# Patient Record
Sex: Male | Born: 1970 | Race: Black or African American | Hispanic: No | Marital: Single | Smoking: Never smoker
Health system: Southern US, Community
[De-identification: ages and names within clinical notes are randomized; demographics above are authoritative.]

## PROBLEM LIST (undated history)

## (undated) DIAGNOSIS — I10 Essential (primary) hypertension: Secondary | ICD-10-CM

---

## 2017-12-18 ENCOUNTER — Emergency Department (HOSPITAL_COMMUNITY): Payer: Self-pay

## 2017-12-18 ENCOUNTER — Encounter (HOSPITAL_COMMUNITY): Payer: Self-pay | Admitting: Emergency Medicine

## 2017-12-18 ENCOUNTER — Other Ambulatory Visit: Payer: Self-pay

## 2017-12-18 ENCOUNTER — Emergency Department (HOSPITAL_COMMUNITY)
Admission: EM | Admit: 2017-12-18 | Discharge: 2017-12-18 | Disposition: A | Discharge status: Home / Self Care | Payer: Self-pay | Attending: Emergency Medicine | Admitting: Emergency Medicine

## 2017-12-18 DIAGNOSIS — I10 Essential (primary) hypertension: Secondary | ICD-10-CM | POA: Insufficient documentation

## 2017-12-18 DIAGNOSIS — Y9301 Activity, walking, marching and hiking: Secondary | ICD-10-CM | POA: Insufficient documentation

## 2017-12-18 DIAGNOSIS — Y92511 Restaurant or cafe as the place of occurrence of the external cause: Secondary | ICD-10-CM | POA: Insufficient documentation

## 2017-12-18 DIAGNOSIS — W010XXA Fall on same level from slipping, tripping and stumbling without subsequent striking against object, initial encounter: Secondary | ICD-10-CM | POA: Insufficient documentation

## 2017-12-18 DIAGNOSIS — M79662 Pain in left lower leg: Secondary | ICD-10-CM | POA: Insufficient documentation

## 2017-12-18 DIAGNOSIS — W19XXXA Unspecified fall, initial encounter: Secondary | ICD-10-CM

## 2017-12-18 DIAGNOSIS — Y999 Unspecified external cause status: Secondary | ICD-10-CM | POA: Insufficient documentation

## 2017-12-18 DIAGNOSIS — M62838 Other muscle spasm: Secondary | ICD-10-CM | POA: Insufficient documentation

## 2017-12-18 DIAGNOSIS — S40012A Contusion of left shoulder, initial encounter: Secondary | ICD-10-CM | POA: Insufficient documentation

## 2017-12-18 HISTORY — DX: Essential (primary) hypertension: I10

## 2017-12-18 MED ORDER — IBUPROFEN 200 MG PO TABS
400.0000 mg | ORAL_TABLET | Freq: Once | ORAL | Status: AC
Start: 2017-12-18 — End: 2017-12-18
  Administered 2017-12-18: 400 mg via ORAL
  Filled 2017-12-18: qty 2

## 2017-12-18 MED ORDER — CYCLOBENZAPRINE HCL 10 MG PO TABS: 10.0000 mg | ORAL_TABLET | Freq: Two times a day (BID) | ORAL | 0 refills | Status: AC | PRN

## 2017-12-18 MED ORDER — ACETAMINOPHEN 500 MG PO TABS
1000.0000 mg | ORAL_TABLET | Freq: Once | ORAL | Status: AC
  Administered 2017-12-18: 1000 mg via ORAL
  Filled 2017-12-18: qty 2

## 2017-12-18 NOTE — ED Triage Notes (Signed)
Pt verbalizes slipped and fell on wet floor; complaint of left neck, shoulder, and hand pain.
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
## 2017-12-18 NOTE — ED Notes (Signed)
Patient transported to X-ray 

## 2019-04-14 ENCOUNTER — Encounter (HOSPITAL_BASED_OUTPATIENT_CLINIC_OR_DEPARTMENT_OTHER): Payer: Self-pay | Admitting: *Deleted

## 2019-04-14 ENCOUNTER — Other Ambulatory Visit: Payer: Self-pay

## 2019-04-14 ENCOUNTER — Emergency Department (HOSPITAL_BASED_OUTPATIENT_CLINIC_OR_DEPARTMENT_OTHER)
Admission: EM | Admit: 2019-04-14 | Discharge: 2019-04-14 | Disposition: A | Discharge status: Home / Self Care | Payer: BLUE CROSS/BLUE SHIELD | Attending: Emergency Medicine | Admitting: Emergency Medicine

## 2019-04-14 DIAGNOSIS — Z79899 Other long term (current) drug therapy: Secondary | ICD-10-CM | POA: Insufficient documentation

## 2019-04-14 DIAGNOSIS — I10 Essential (primary) hypertension: Secondary | ICD-10-CM | POA: Insufficient documentation

## 2019-04-14 DIAGNOSIS — L03211 Cellulitis of face: Secondary | ICD-10-CM | POA: Diagnosis not present

## 2019-04-14 DIAGNOSIS — R220 Localized swelling, mass and lump, head: Secondary | ICD-10-CM | POA: Diagnosis present

## 2019-04-14 NOTE — ED Triage Notes (Addendum)
Abscess to his forehead  X 4 days. He has swelling and redness under his eyes that started after taking 2 antibiotics Rx by [facility].

## 2019-04-14 NOTE — Discharge Instructions (Signed)
Please keep taking your antibiotic.  You may use warm compresses on the area.  You may take ibuprofen or Tylenol as needed for pain.  If you develop fevers, worsening symptoms, or have additional concerns please return to the emergency room for repeat evaluation.  If you do not have significant improvement in 2 days I would recommend that you return to the emergency room for repeat evaluation.  As we discussed today based on the ultrasound it does not appear that you need your abscess cut into today.  You may need it to be drained in the future.

## 2019-04-14 NOTE — ED Provider Notes (Signed)
[facility] EMERGENCY DEPARTMENT Provider Note   CSN: [id number] Arrival date & time: 04/14/19  1809    History   Chief Complaint Chief Complaint  Patient presents with  . Abscess    HPI Scott Randall is a 48 y.o. male with a past medical history of hypertension presents today for evaluation of swelling on his forehead and redness under his eyes.  The swelling on his forehead started approximately 4 days ago.  He went to urgent care and he was placed on Augmentin and doxycycline.  2 days ago he woke up with swelling under his bilateral eyes, he contacted urgent care and they changed his antibiotic to clindamycin.  He reports that he continues to have intermittent redness/swelling under his bilateral eyes.  He denies any vision changes.  No fevers, nausea, vomiting, or diarrhea.  He denies any pain when he looks around.  No headache, neck stiffness.     HPI  Past Medical History:  Diagnosis Date  . Hypertension     There are no active problems to display for this patient.   History reviewed. No pertinent surgical history.      Home Medications    Prior to Admission medications   Medication Sig Start Date End Date Taking? Authorizing Provider  LISINOPRIL PO Take by mouth.   Yes [provider]  cyclobenzaprine (FLEXERIL) 10 MG tablet Take 1 tablet (10 mg total) by mouth 2 (two) times daily as needed for muscle spasms. 12/18/17   Gwyneth SproutPlunkett, Whitney, MD    Family History No family history on file.  Social History Social History   Tobacco Use  . Smoking status: Never Smoker  . Smokeless tobacco: Never Used  Substance Use Topics  . Alcohol use: No    Frequency: Never  . Drug use: No     Allergies   Patient has no known allergies.   Review of Systems Review of Systems  Constitutional: Negative for chills, diaphoresis and fever.  HENT: Positive for facial swelling.   Eyes: Negative for pain, redness and visual disturbance.  Cardiovascular:  Negative for chest pain.  Gastrointestinal: Negative for anal bleeding.  Musculoskeletal: Negative for back pain.  Neurological: Negative for dizziness and facial asymmetry.  All other systems reviewed and are negative.    Physical Exam Updated Vital Signs BP (!) 161/111   Pulse (!) 106   Temp 98.2 F (36.8 C) (Oral)   Resp 20   Ht 5\' 4"  (1.626 m)   Wt 102.1 kg   SpO2 98%   BMI 38.62 kg/m   Physical Exam Vitals signs and nursing note reviewed.  HENT:     Head:     Comments: Please see clinical image.  There is approximately 1.5 x 1.5 area of redness, induration, and swelling on patient's right sided forehead.  There is mild redness under bilateral eyes along the tops of the cheekbones without market periorbital edema.  He has full, pain-free, extraocular range of motion.    Nose: Nose normal. No congestion.  Eyes:     Conjunctiva/sclera: Conjunctivae normal.  Neck:     Musculoskeletal: Normal range of motion and neck supple. No neck rigidity.  Cardiovascular:     Rate and Rhythm: Normal rate.     Pulses: Normal pulses.  Pulmonary:     Effort: Pulmonary effort is normal.  Skin:    General: Skin is warm.  Neurological:     General: No focal deficit present.     Mental Status: He is  alert.  Psychiatric:        Mood and Affect: Mood normal.      ED Treatments / Results  Labs (all labs ordered are listed, but only abnormal results are displayed) Labs Reviewed - No data to display  EKG None  Radiology No results found.  Procedures Ultrasound ED Soft Tissue Date/Time: 04/14/2019 6:59 PM Performed by: Cristina Gong, PA-C Authorized by: Cristina Gong, PA-C   Procedure details:    Indications: localization of abscess and evaluate for cellulitis     Transverse view:  Visualized   Longitudinal view:  Visualized   Images: archived   Location:    Location: face     Side:  Right Findings:     abscess present (Small, under 1cm)    cellulitis  present   (including critical care time)  Medications Ordered in ED Medications - No data to display   Initial Impression / Assessment and Plan / ED Course  I have reviewed the triage vital signs and the nursing notes.  Pertinent labs & imaging results that were available during my care of the patient were reviewed by me and considered in my medical decision making (see chart for details).  Clinical Course as of Apr 14 1855  Thu Apr 14, 2019  1850 HR 98.  Afebrile   [EH]    Clinical Course User Index [EH] Cristina Gong, PA-C      Scott Shad presents today for evaluation of swelling and redness on his forehead.  This is been present for 4 days.  He was placed initially on Augmentin and doxycycline by urgent care, after he had swelling under his eyes they change his antibiotic to clindamycin, so far he has had 2 doses of clindamycin.  Bedside ultrasound was obtained, does not show a fluid collection that would be easily drainable at this time.  He is hypertensive, he is aware of this.  He is afebrile, on recheck he is not tachycardic or tachypneic.  Do not suspect sepsis.  I discussed treatment options with him, recommended that he continue taking his antibiotics and return for wound recheck in 2 days if he has not had significant improvement.  Physical exam is not consistent with orbital pre-or post septal cellulitis.   Return precautions were discussed with patient who states their understanding.  At the time of discharge patient denied any unaddressed complaints or concerns.  Patient is agreeable for discharge home.   Final Clinical Impressions(s) / ED Diagnoses   Final diagnoses:  Cellulitis of face    ED Discharge Orders    None       Norman Clay 04/14/19 1900    Geoffery Lyons, MD 04/14/19 2139

## 2019-04-19 ENCOUNTER — Other Ambulatory Visit: Payer: Self-pay

## 2019-04-19 ENCOUNTER — Encounter (HOSPITAL_BASED_OUTPATIENT_CLINIC_OR_DEPARTMENT_OTHER): Payer: Self-pay

## 2019-04-19 ENCOUNTER — Emergency Department (HOSPITAL_BASED_OUTPATIENT_CLINIC_OR_DEPARTMENT_OTHER)
Admission: EM | Admit: 2019-04-19 | Discharge: 2019-04-19 | Disposition: A | Discharge status: Home / Self Care | Payer: BLUE CROSS/BLUE SHIELD | Attending: Emergency Medicine | Admitting: Emergency Medicine

## 2019-04-19 DIAGNOSIS — Z79899 Other long term (current) drug therapy: Secondary | ICD-10-CM | POA: Diagnosis not present

## 2019-04-19 DIAGNOSIS — L0201 Cutaneous abscess of face: Secondary | ICD-10-CM | POA: Diagnosis not present

## 2019-04-19 DIAGNOSIS — I10 Essential (primary) hypertension: Secondary | ICD-10-CM | POA: Diagnosis not present

## 2019-04-19 DIAGNOSIS — L0291 Cutaneous abscess, unspecified: Secondary | ICD-10-CM

## 2019-04-19 MED ORDER — LIDOCAINE-EPINEPHRINE 2 %-1:100000 IJ SOLN
20.0000 mL | Freq: Once | INTRAMUSCULAR | Status: DC
  Filled 2019-04-19: qty 20

## 2019-04-19 MED ORDER — LIDOCAINE-EPINEPHRINE (PF) 2 %-1:200000 IJ SOLN
INTRAMUSCULAR | Status: AC
  Administered 2019-04-19: 09:00:00
  Filled 2019-04-19: qty 10

## 2019-04-19 MED ORDER — OXYCODONE HCL 5 MG PO TABS
5.0000 mg | ORAL_TABLET | Freq: Once | ORAL | Status: DC
  Filled 2019-04-19: qty 1

## 2019-04-19 MED ORDER — ACETAMINOPHEN 500 MG PO TABS
1000.0000 mg | ORAL_TABLET | Freq: Once | ORAL | Status: AC
Start: 2019-04-19 — End: 2019-04-19
  Administered 2019-04-19: 1000 mg via ORAL
  Filled 2019-04-19: qty 2

## 2019-04-19 NOTE — ED Triage Notes (Signed)
Pt here for recheck of abcess forehead, states since last being seen has a new area left shoulder.  Denies fever. Area red, raised.

## 2019-04-19 NOTE — ED Provider Notes (Signed)
[facility] EMERGENCY DEPARTMENT Provider Note   CSN: [id number] Arrival date & time: 04/19/19  7829    History   Chief Complaint Chief Complaint  Patient presents with  . Abscess    HPI Scott Randall is a 48 y.o. male.     48 yo M here with a chief complaint of forehead abscess.  This been going on for the past week or so.  Is gotten much larger over the past couple days.  No fevers or chills.  He tried to lance it himself yesterday and it aches significantly more swollen this morning.  He has been to the ED for this previously and had an ultrasound that showed a very small abscess that was not drained.  He is on clindamycin currently.  He also noted that he has some swelling in a mobile spot to his left shoulder just above the clavicle.  He denies night sweats denies unintentional weight loss denies generalized fatigue.  The history is provided by the patient.  Abscess  Location:  Face Facial abscess location:  Face and forehead Size:  Golf ball Abscess quality: draining, fluctuance, induration, itching and painful   Red streaking: no   Duration:  1 week Progression:  Worsening Pain details:    Quality:  Aching and burning   Severity:  Mild   Duration:  1 week   Timing:  Constant   Progression:  Worsening Chronicity:  New Context: not diabetes, not immunosuppression, not injected drug use, not insect bite/sting and not skin injury   Relieved by:  Nothing Worsened by:  Nothing Ineffective treatments:  Oral antibiotics Associated symptoms: no anorexia, no fatigue, no fever, no headaches, no nausea and no vomiting     Past Medical History:  Diagnosis Date  . Hypertension     There are no active problems to display for this patient.   History reviewed. No pertinent surgical history.      Home Medications    Prior to Admission medications   Medication Sig Start Date End Date Taking? Authorizing Provider  cyclobenzaprine (FLEXERIL) 10 MG tablet Take  1 tablet (10 mg total) by mouth 2 (two) times daily as needed for muscle spasms. 12/18/17   Gwyneth Sprout, MD  LISINOPRIL PO Take by mouth.    [provider]    Family History History reviewed. No pertinent family history.  Social History Social History   Tobacco Use  . Smoking status: Never Smoker  . Smokeless tobacco: Never Used  Substance Use Topics  . Alcohol use: No    Frequency: Never  . Drug use: No     Allergies   Patient has no known allergies.   Review of Systems Review of Systems  Constitutional: Negative for chills, fatigue and fever.  HENT: Negative for congestion and facial swelling.   Eyes: Negative for discharge and visual disturbance.  Respiratory: Negative for shortness of breath.   Cardiovascular: Negative for chest pain and palpitations.  Gastrointestinal: Negative for abdominal pain, anorexia, diarrhea, nausea and vomiting.  Musculoskeletal: Negative for arthralgias and myalgias.  Skin: Positive for wound. Negative for color change and rash.  Neurological: Negative for tremors, syncope and headaches.  Psychiatric/Behavioral: Negative for confusion and dysphoric mood.     Physical Exam Updated Vital Signs BP (!) 153/88 (BP Location: Right Arm)   Pulse (!) 102   Temp 98.2 F (36.8 C) (Oral)   Resp 16   Ht 5\' 4"  (1.626 m)   Wt 101.6 kg   SpO2  99%   BMI 38.45 kg/m   Physical Exam Vitals signs and nursing note reviewed.  Constitutional:      Appearance: He is well-developed.  HENT:     Head: Normocephalic and atraumatic.   Eyes:     Pupils: Pupils are equal, round, and reactive to light.  Neck:     Musculoskeletal: Normal range of motion and neck supple.     Vascular: No JVD.  Cardiovascular:     Rate and Rhythm: Normal rate and regular rhythm.     Heart sounds: No murmur. No friction rub. No gallop.   Pulmonary:     Effort: No respiratory distress.     Breath sounds: No wheezing.  Abdominal:     General: There is no  distension.     Tenderness: There is no guarding or rebound.  Musculoskeletal: Normal range of motion.     Comments: Mobile nontender lymph node to the left supraclavicular region  Skin:    Coloration: Skin is not pale.     Findings: No rash.  Neurological:     Mental Status: He is alert and oriented to person, place, and time.  Psychiatric:        Behavior: Behavior normal.      ED Treatments / Results  Labs (all labs ordered are listed, but only abnormal results are displayed) Labs Reviewed - No data to display  EKG None  Radiology No results found.  Procedures .Nerve Block Date/Time: 04/19/2019 9:12 AM Performed by: Melene Plan, DO Authorized by: Melene Plan, DO   Consent:    Consent obtained:  Verbal   Consent given by:  Patient   Risks discussed:  Infection, bleeding and allergic reaction   Alternatives discussed:  Alternative treatment Indications:    Indications:  Procedural anesthesia Location:    Body area:  Head   Head nerve:  Supratrochlear   Laterality:  Right Pre-procedure details:    Skin preparation:  2% chlorhexidine   Preparation: Patient was prepped and draped in usual sterile fashion   Skin anesthesia (see MAR for exact dosages):    Skin anesthesia method:  Local infiltration   Local anesthetic:  Lidocaine 2% WITH epi Procedure details (see MAR for exact dosages):    Block needle gauge:  21 G   Steroid injected:  None   Additive injected:  None   Injection procedure:  Anatomic landmarks identified and anatomic landmarks palpated   Paresthesia:  None Post-procedure details:    Dressing:  None   Outcome:  Anesthesia achieved   Patient tolerance of procedure:  Tolerated well, no immediate complications .Marland KitchenIncision and Drainage Date/Time: 04/19/2019 9:13 AM Performed by: Melene Plan, DO Authorized by: Melene Plan, DO   Consent:    Consent obtained:  Verbal   Consent given by:  Patient   Risks discussed:  Bleeding, incomplete drainage and  infection   Alternatives discussed:  No treatment, delayed treatment and alternative treatment Location:    Type:  Abscess   Size:  Golf ball   Location:  Head   Head location:  Face Pre-procedure details:    Skin preparation:  Chloraprep Anesthesia (see MAR for exact dosages):    Anesthesia method:  Nerve block and local infiltration   Local anesthetic:  Lidocaine 2% WITH epi   Block needle gauge:  25 G   Block anesthetic:  Lidocaine 2% WITH epi   Block injection procedure:  Anatomic landmarks identified and anatomic landmarks palpated   Block outcome:  Anesthesia achieved Procedure  type:    Complexity:  Complex Procedure details:    Needle aspiration: no     Incision types:  Single straight   Incision depth:  Subcutaneous   Scalpel blade:  11   Wound management:  Probed and deloculated   Drainage:  Purulent   Drainage amount:  Copious   Wound treatment:  Wound left open   Packing materials:  None Post-procedure details:    Patient tolerance of procedure:  Tolerated well, no immediate complications   (including critical care time)  Medications Ordered in ED Medications  lidocaine-EPINEPHrine (XYLOCAINE W/EPI) 2 %-1:100000 (with pres) injection 20 mL (20 mLs Infiltration Not Given 04/19/19 0852)  oxyCODONE (Oxy IR/ROXICODONE) immediate release tablet 5 mg (5 mg Oral Not Given 04/19/19 0847)  acetaminophen (TYLENOL) tablet 1,000 mg (1,000 mg Oral Given 04/19/19 0846)  lidocaine-EPINEPHrine (XYLOCAINE W/EPI) 2 %-1:200000 (PF) injection (  Given by Other 04/19/19 [id number])     Initial Impression / Assessment and Plan / ED Course  I have reviewed the triage vital signs and the nursing notes.  Pertinent labs & imaging results that were available during my care of the patient were reviewed by me and considered in my medical decision making (see chart for details).        48 yo M with a chief complaint of a abscess.  Patient has been on antibiotics but has had worsening.  I will  lanced at bedside.  The patient did have a lot of cystlike material that came out.  There was some purulence involved as well.  We will have him continue his antibiotics.  Follow-up with his family doctor.  Of note the patient also has a swollen supraclavicular lymph node.  He has no B symptoms.  This may be due to the current abscess.  We will have him follow-up with his family doctor for reassessment if this does not resolve post infection.  9:25 AM:  I have discussed the diagnosis/risks/treatment options with the patient and believe the pt to be eligible for discharge home to follow-up with PCP. We also discussed returning to the ED immediately if new or worsening sx occur. We discussed the sx which are most concerning (e.g., sudden worsening pain, fever, inability to tolerate by mouth) that necessitate immediate return. Medications administered to the patient during their visit and any new prescriptions provided to the patient are listed below.  Medications given during this visit Medications  lidocaine-EPINEPHrine (XYLOCAINE W/EPI) 2 %-1:100000 (with pres) injection 20 mL (20 mLs Infiltration Not Given 04/19/19 0852)  oxyCODONE (Oxy IR/ROXICODONE) immediate release tablet 5 mg (5 mg Oral Not Given 04/19/19 0847)  acetaminophen (TYLENOL) tablet 1,000 mg (1,000 mg Oral Given 04/19/19 0846)  lidocaine-EPINEPHrine (XYLOCAINE W/EPI) 2 %-1:200000 (PF) injection (  Given by Other 04/19/19 [id number])     The patient appears reasonably screen and/or stabilized for discharge and I doubt any other medical condition or other [facility] requiring further screening, evaluation, or treatment in the ED at this time prior to discharge.    Final Clinical Impressions(s) / ED Diagnoses   Final diagnoses:  Abscess    ED Discharge Orders    None       Melene PlanFloyd, Bijal Siglin, DO 04/19/19 [id number]

## 2019-04-19 NOTE — Discharge Instructions (Signed)
Warm compresses at least 4x a day.  Continue your antibiotics until gone.  Return to the ED for fever, rapid spreading redness or if you feel unwell.    Have your family doc take a look at the swelling to your left shoulder if it persists after this infection has ended.

## 2019-06-21 ENCOUNTER — Encounter (HOSPITAL_BASED_OUTPATIENT_CLINIC_OR_DEPARTMENT_OTHER): Payer: Self-pay | Admitting: *Deleted

## 2019-06-21 ENCOUNTER — Emergency Department (HOSPITAL_BASED_OUTPATIENT_CLINIC_OR_DEPARTMENT_OTHER): Payer: BLUE CROSS/BLUE SHIELD

## 2019-06-21 ENCOUNTER — Emergency Department (HOSPITAL_BASED_OUTPATIENT_CLINIC_OR_DEPARTMENT_OTHER)
Admission: EM | Admit: 2019-06-21 | Discharge: 2019-06-21 | Disposition: A | Discharge status: Home / Self Care | Payer: BLUE CROSS/BLUE SHIELD | Attending: Emergency Medicine | Admitting: Emergency Medicine

## 2019-06-21 ENCOUNTER — Other Ambulatory Visit: Payer: Self-pay

## 2019-06-21 DIAGNOSIS — Y9389 Activity, other specified: Secondary | ICD-10-CM | POA: Diagnosis not present

## 2019-06-21 DIAGNOSIS — Y998 Other external cause status: Secondary | ICD-10-CM | POA: Insufficient documentation

## 2019-06-21 DIAGNOSIS — Z79899 Other long term (current) drug therapy: Secondary | ICD-10-CM | POA: Diagnosis not present

## 2019-06-21 DIAGNOSIS — Y92018 Other place in single-family (private) house as the place of occurrence of the external cause: Secondary | ICD-10-CM | POA: Diagnosis not present

## 2019-06-21 DIAGNOSIS — S93401A Sprain of unspecified ligament of right ankle, initial encounter: Secondary | ICD-10-CM | POA: Diagnosis not present

## 2019-06-21 DIAGNOSIS — I10 Essential (primary) hypertension: Secondary | ICD-10-CM | POA: Insufficient documentation

## 2019-06-21 DIAGNOSIS — X501XXA Overexertion from prolonged static or awkward postures, initial encounter: Secondary | ICD-10-CM | POA: Insufficient documentation

## 2019-06-21 DIAGNOSIS — S99911A Unspecified injury of right ankle, initial encounter: Secondary | ICD-10-CM | POA: Diagnosis present

## 2019-06-21 NOTE — Discharge Instructions (Addendum)
You were seen today for R ankle pain. Your xray did not show signs of fracture. Continue to use ibuprofen every 4-6 hours as needed for pain and swelling. Continue to rest your ankle, ice, and wrap in an ASO brace. You can find this from [place]. Consider calling the Sports Medicine clinic for a follow up evaluation.

## 2019-06-21 NOTE — ED Triage Notes (Addendum)
Rolled rt ankle 3 weeks ago coming down steps w increased pain and swelling,  Ambulatory without diff

## 2019-06-21 NOTE — ED Provider Notes (Signed)
[facility] EMERGENCY DEPARTMENT Provider Note   CSN: [id number] Arrival date & time: 06/21/19  0827    History   Chief Complaint Chief Complaint  Patient presents with  . Ankle Pain    HPI Scott Randall is a 48 y.o. male with PMG significant for HTN, obesity here for R ankle pain.  3 weeks ago he was coming down the stairs at his home and rolled his right ankle outwards with subsequent pain.  He states he was able to put weight on it immediately after.  He has been taking intermittent ibuprofen and has put ice on it and wrapping with an Ace bandage a few times but still with persistent pain and swelling, thinks it is getting worse.  He is able to walk without assistance.  Denies any prior surgeries or instrumentation.  Denies any fevers or redness.  Past Medical History:  Diagnosis Date  . Hypertension     There are no active problems to display for this patient.   History reviewed. No pertinent surgical history.    Home Medications    Prior to Admission medications   Medication Sig Start Date End Date Taking? Authorizing Provider  cyclobenzaprine (FLEXERIL) 10 MG tablet Take 1 tablet (10 mg total) by mouth 2 (two) times daily as needed for muscle spasms. 12/18/17   Blanchie Dessert, MD  LISINOPRIL PO Take by mouth.    [provider]    Family History No family history on file.  Social History Social History   Tobacco Use  . Smoking status: Never Smoker  . Smokeless tobacco: Never Used  Substance Use Topics  . Alcohol use: No    Frequency: Never  . Drug use: No    Allergies   Patient has no known allergies.   Review of Systems Review of Systems  Constitutional: Negative for appetite change and fever.  Respiratory: Negative for shortness of breath.   Cardiovascular: Negative for chest pain.  Gastrointestinal: Negative for abdominal pain, nausea and vomiting.  Musculoskeletal: Positive for joint swelling.    Physical Exam Updated  Vital Signs BP (!) 146/101 (BP Location: Left Arm)   Pulse 81   Temp 98.8 F (37.1 C) (Oral)   Resp 18   Ht 5\' 5"  (1.651 m)   Wt 99.8 kg   SpO2 98%   BMI 36.61 kg/m   Physical Exam Constitutional:      Appearance: Normal appearance. He is not ill-appearing or toxic-appearing.  Cardiovascular:     Rate and Rhythm: Normal rate and regular rhythm.     Heart sounds: Normal heart sounds. No murmur.  Pulmonary:     Effort: Pulmonary effort is normal. No respiratory distress.     Breath sounds: Normal breath sounds.  Abdominal:     General: Bowel sounds are normal.     Tenderness: There is no abdominal tenderness.  Musculoskeletal:     Right lower leg: No edema.     Left lower leg: No edema.     Comments: Slight swelling noted to lateral R ankle without erythema or point tenderness to lateral malleolus or 5th metatarsal head. No joint laxity on exam. 2+ DP pulses.   Skin:    General: Skin is warm and dry.     Comments: Tinea pedis noted to 3-5th digits on R foot.  Neurological:     General: No focal deficit present.     Mental Status: He is oriented to person, place, and time.     ED Treatments /  Results  Labs (all labs ordered are listed, but only abnormal results are displayed) Labs Reviewed - No data to display  EKG None  Radiology Dg Ankle Complete Right  Result Date: 06/21/2019 CLINICAL DATA:  Pain and swelling since an ankle injury 3 weeks ago. EXAM: RIGHT ANKLE - COMPLETE 3+ VIEW COMPARISON:  None. FINDINGS: There is no evidence of fracture, dislocation, or joint effusion. There is no evidence of arthropathy or other focal bone abnormality. Soft tissues are unremarkable. IMPRESSION: Negative. Electronically Signed   By: Francene BoyersJames  Maxwell M.D.   On: 06/21/2019 09:43    Procedures Procedures (including critical care time)  Medications Ordered in ED Medications - No data to display   Initial Impression / Assessment and Plan / ED Course  I have reviewed the triage  vital signs and the nursing notes.  Pertinent labs & imaging results that were available during my care of the patient were reviewed by me and considered in my medical decision making (see chart for details).   48yo M w/ h/o HTN here for R ankle pain after eversion injury. No joint laxity on exam or point tenderness to lateral malleolus or 5th metatarsal head. XR obtained given duration of symptoms, which was negative for fracture. Recommended continued conservative treatment with rest, ice, compression, elevation, and NSAIDs. Has spray at home for tinea pedis. Also recommended follow up with Sports Medicine if no better in a few weeks.    Final Clinical Impressions(s) / ED Diagnoses   Final diagnoses:  Sprain of right ankle, unspecified ligament, initial encounter    ED Discharge Orders    None       Ellwood DenseRumball, Alison, DO 06/21/19 1023    Blane OharaZavitz, Joshua, MD 06/21/19 1439

## 2019-11-10 IMAGING — DX RIGHT ANKLE - COMPLETE 3+ VIEW
3 series · 3 of 3 positions shown · non-contrast
Comparison: None.

CLINICAL DATA: Pain and swelling since an ankle injury 3 weeks ago.

EXAM:
RIGHT ANKLE - COMPLETE 3+ VIEW

[ankle ap]
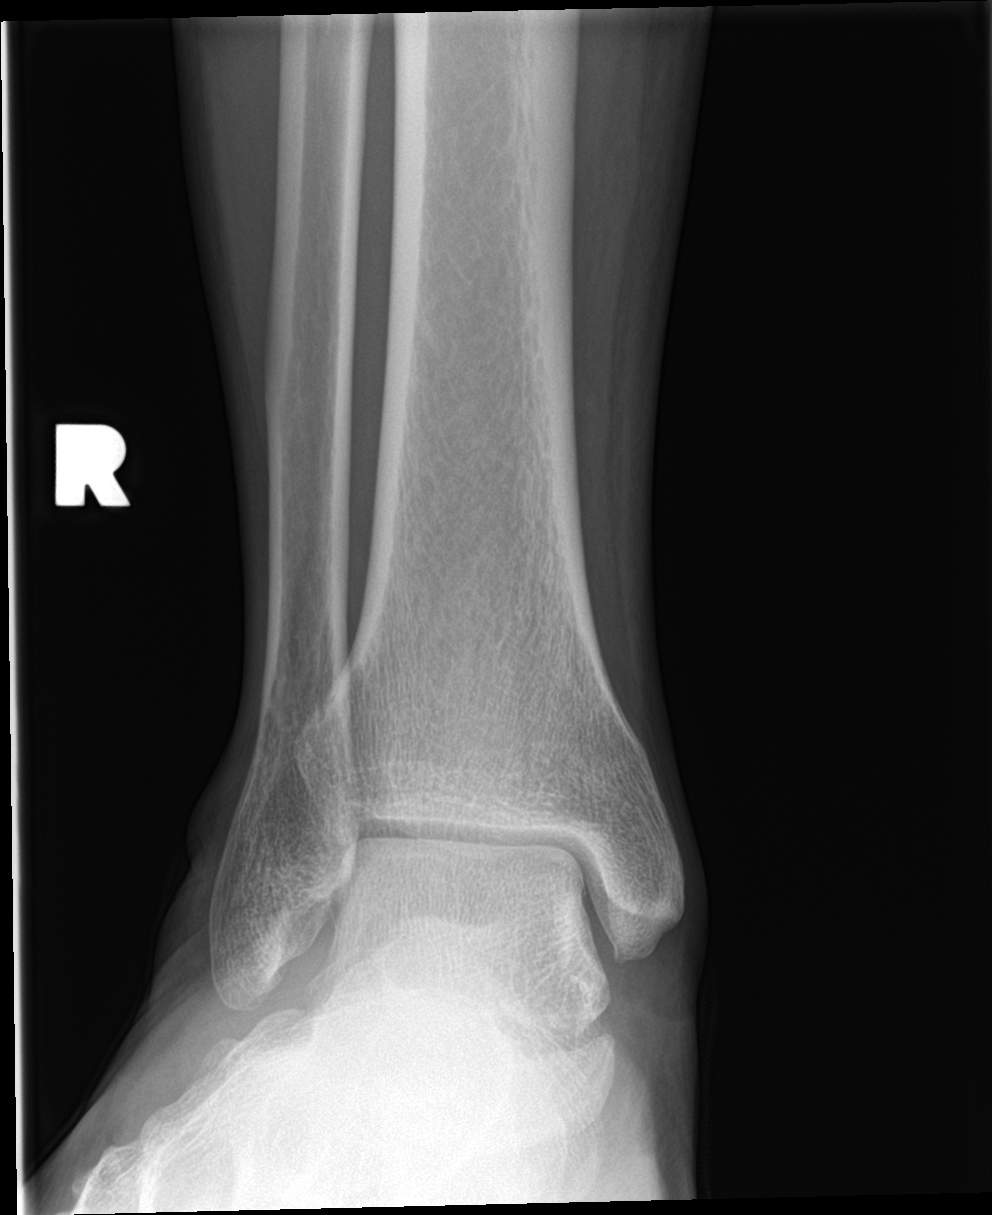

[ankle obl]
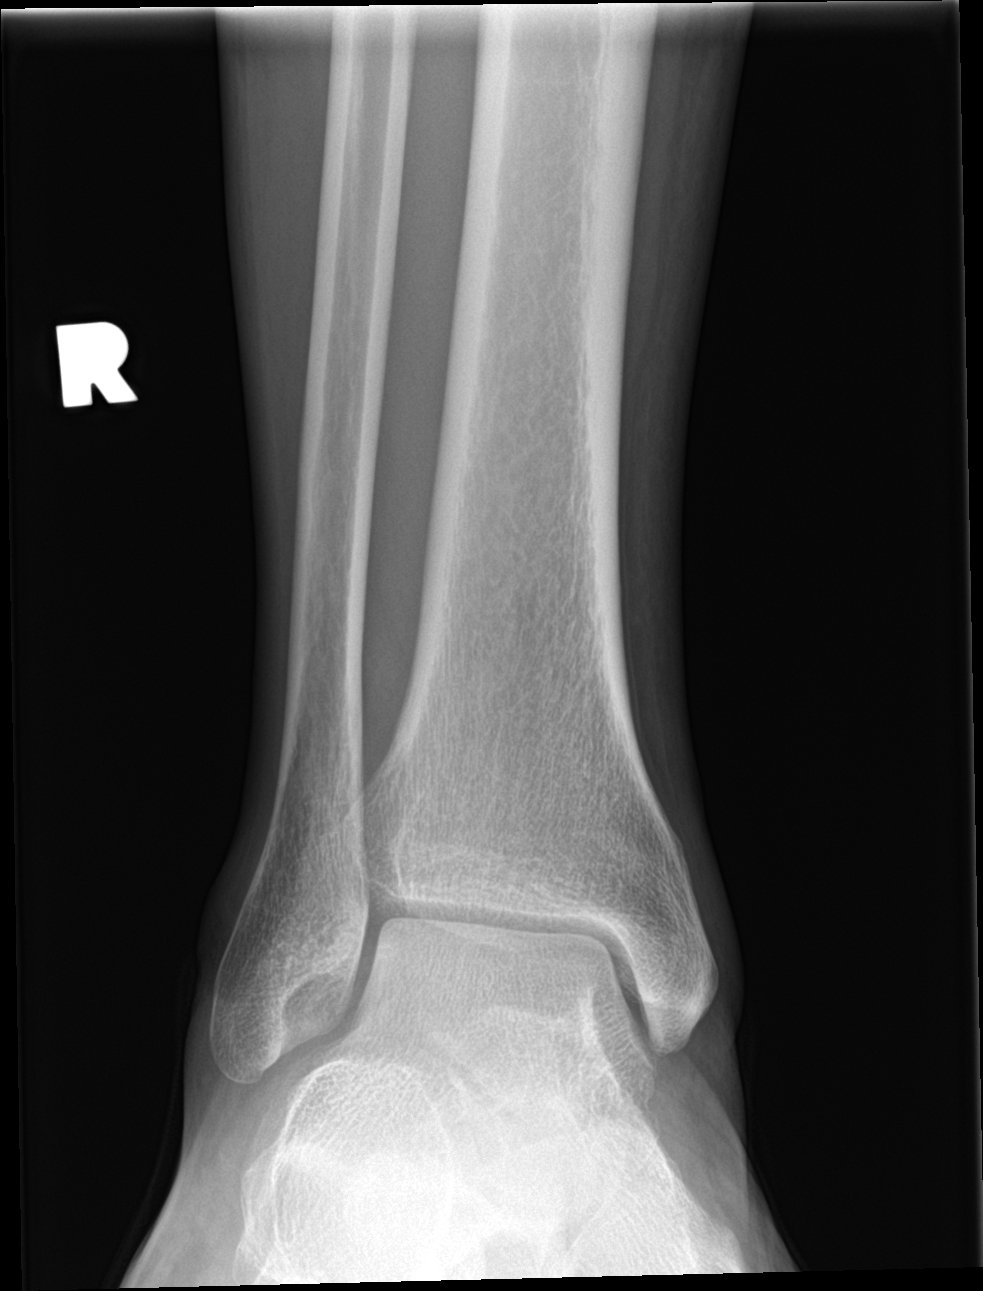

[ankle lat]
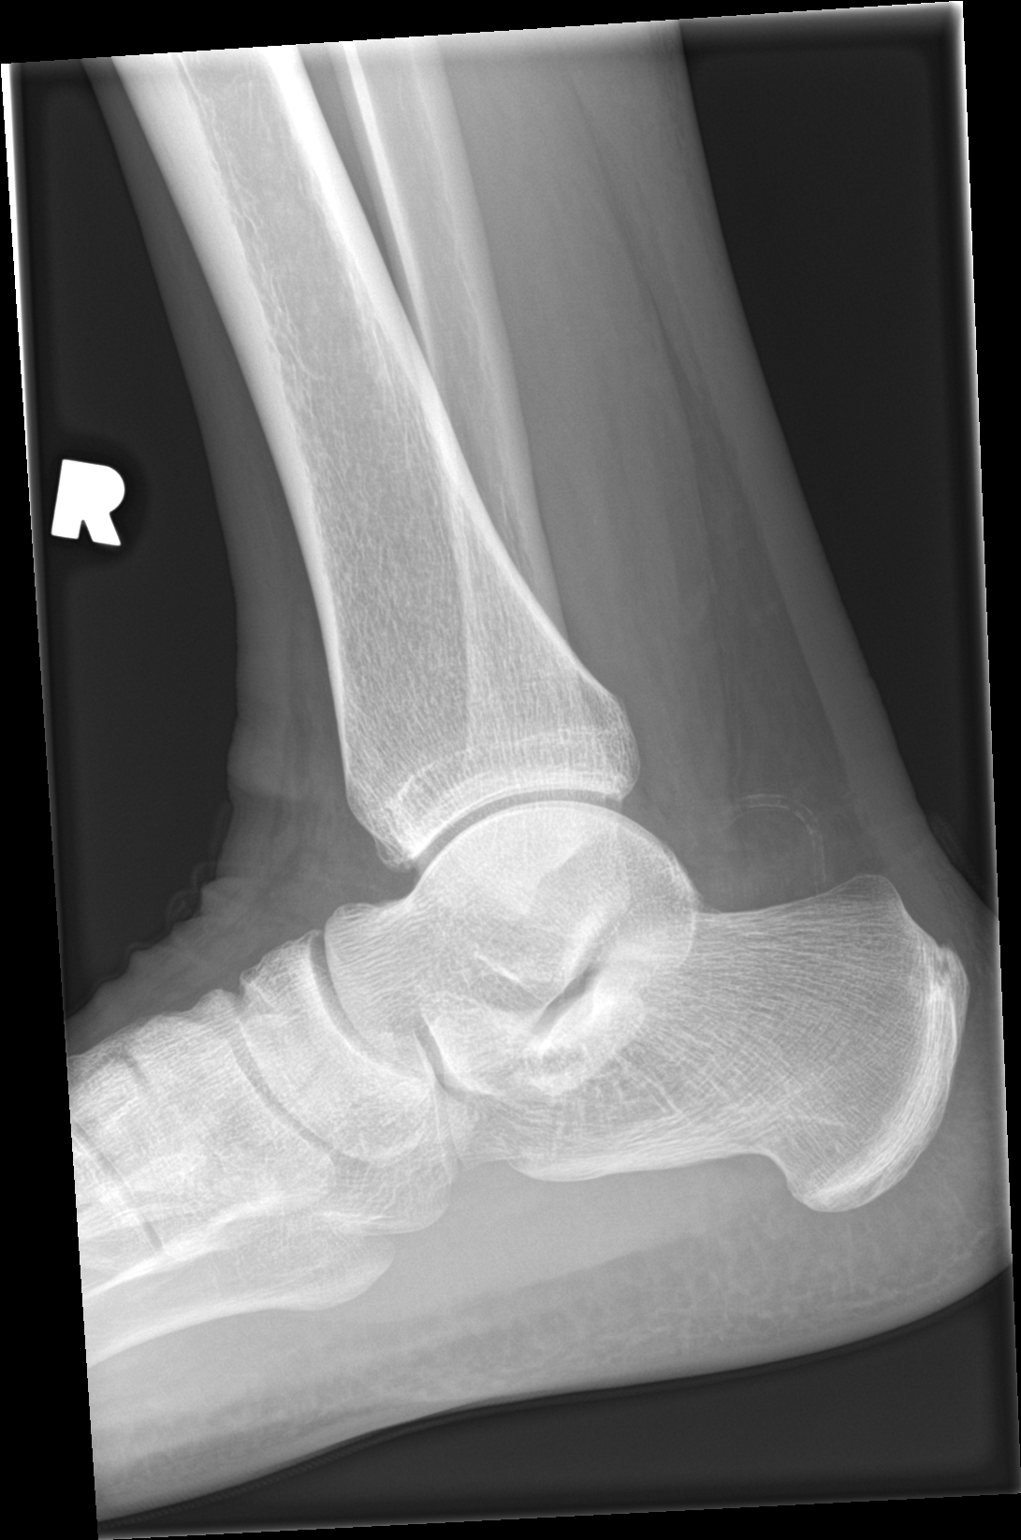

[3 of 3 positions shown; findings below may reference images not displayed]

FINDINGS: There is no evidence of fracture, dislocation, or joint effusion.
There is no evidence of arthropathy or other focal bone abnormality.
Soft tissues are unremarkable.
IMPRESSION: Negative.

## 2020-01-28 ENCOUNTER — Other Ambulatory Visit: Payer: Self-pay | Admitting: *Deleted

## 2020-01-28 DIAGNOSIS — Z20822 Contact with and (suspected) exposure to covid-19: Secondary | ICD-10-CM

## 2020-01-29 LAB — NOVEL CORONAVIRUS, NAA: SARS-CoV-2, NAA: NOT DETECTED

## 2020-01-30 ENCOUNTER — Telehealth: Payer: Self-pay | Admitting: General Practice

## 2020-01-30 ENCOUNTER — Telehealth: Payer: Self-pay | Admitting: *Deleted

## 2020-01-30 NOTE — Telephone Encounter (Signed)
Called pt regarding his covid 19 test result. He is advised to quarantine since he has been living with his brother who tested positive. He voiced understanding.

## 2020-01-30 NOTE — Telephone Encounter (Signed)
Patient is calling is receive his COVID test results. Patient expressed understanding.

## 2022-08-17 ENCOUNTER — Encounter (HOSPITAL_BASED_OUTPATIENT_CLINIC_OR_DEPARTMENT_OTHER): Payer: Self-pay | Admitting: Emergency Medicine

## 2022-08-17 ENCOUNTER — Emergency Department (HOSPITAL_BASED_OUTPATIENT_CLINIC_OR_DEPARTMENT_OTHER)
Admission: EM | Admit: 2022-08-17 | Discharge: 2022-08-17 | Disposition: A | Discharge status: Home / Self Care | Payer: Self-pay | Attending: Emergency Medicine | Admitting: Emergency Medicine

## 2022-08-17 ENCOUNTER — Emergency Department (HOSPITAL_BASED_OUTPATIENT_CLINIC_OR_DEPARTMENT_OTHER): Payer: Self-pay

## 2022-08-17 DIAGNOSIS — X501XXA Overexertion from prolonged static or awkward postures, initial encounter: Secondary | ICD-10-CM | POA: Insufficient documentation

## 2022-08-17 DIAGNOSIS — S93402A Sprain of unspecified ligament of left ankle, initial encounter: Secondary | ICD-10-CM | POA: Insufficient documentation

## 2022-08-17 DIAGNOSIS — Y9301 Activity, walking, marching and hiking: Secondary | ICD-10-CM | POA: Insufficient documentation

## 2022-08-17 NOTE — ED Notes (Signed)
Pt declined offer of pain medication, reports has not eaten

## 2022-08-17 NOTE — ED Triage Notes (Signed)
Pt reports L ankle and foot pain for the past few days as well as unsteadiness on his L lower leg. Hx of L calf surgery several years ago. May have tweaked leg when walking down some stairs, but can't think of any other injury.

## 2022-08-17 NOTE — ED Provider Notes (Signed)
[facility] EMERGENCY DEPARTMENT Provider Note   CSN: [id number] Arrival date & time: 08/17/22  1106     History  Chief Complaint  Patient presents with   Ankle Pain    Scott Randall is a 51 y.o. male.  Patient here with left ankle pain after rolling his left ankle earlier today.  No significant injuries otherwise.  No pain elsewhere.  Denies any headache, neck pain.  States this occurred while walking downstairs.  Nothing has made it worse or better.  The history is provided by the patient.       Home Medications Prior to Admission medications   Medication Sig Start Date End Date Taking? Authorizing Provider  cyclobenzaprine (FLEXERIL) 10 MG tablet Take 1 tablet (10 mg total) by mouth 2 (two) times daily as needed for muscle spasms. 12/18/17   Gwyneth Sprout, MD  LISINOPRIL PO Take by mouth.    [provider]      Allergies    Patient has no known allergies.    Review of Systems   Review of Systems  Physical Exam Updated Vital Signs BP (!) 170/103   Pulse 87   Temp 98.5 F (36.9 C) (Oral)   Resp 20   SpO2 99%  Physical Exam Vitals and nursing note reviewed.  Constitutional:      General: He is not in acute distress.    Appearance: He is well-developed. He is not ill-appearing.  HENT:     Head: Normocephalic and atraumatic.  Eyes:     Extraocular Movements: Extraocular movements intact.     Conjunctiva/sclera: Conjunctivae normal.     Pupils: Pupils are equal, round, and reactive to light.  Cardiovascular:     Rate and Rhythm: Normal rate and regular rhythm.     Heart sounds: No murmur heard. Pulmonary:     Effort: Pulmonary effort is normal. No respiratory distress.     Breath sounds: Normal breath sounds.  Abdominal:     Palpations: Abdomen is soft.     Tenderness: There is no abdominal tenderness.  Musculoskeletal:        General: Tenderness present. No swelling. Normal range of motion.     Cervical back: Normal range of  motion and neck supple. No tenderness.     Comments: Tenderness to the left ankle  Skin:    General: Skin is warm and dry.     Capillary Refill: Capillary refill takes less than 2 seconds.  Neurological:     General: No focal deficit present.     Mental Status: He is alert.     Sensory: No sensory deficit.     Motor: No weakness.  Psychiatric:        Mood and Affect: Mood normal.     ED Results / Procedures / Treatments   Labs (all labs ordered are listed, but only abnormal results are displayed) Labs Reviewed - No data to display  EKG None  Radiology DG Ankle Complete Left  Result Date: 08/17/2022 CLINICAL DATA:  Foot and ankle pain.  Possible twisting injury. EXAM: LEFT ANKLE COMPLETE - 3+ VIEW COMPARISON:  None Available. FINDINGS: Mild lateral malleolar soft tissue swelling. No acute fracture or dislocation. IMPRESSION: Soft tissue swelling only. Electronically Signed   By: Jeronimo Greaves M.D.   On: 08/17/2022 11:49    Procedures Procedures    Medications Ordered in ED Medications - No data to display  ED Course/ Medical Decision Making/ A&P  Medical Decision Making Amount and/or Complexity of Data Reviewed Radiology: ordered.   Scott Randall is here with left ankle pain after twisting his left ankle earlier this week.  Normal vitals.  No fever.  X-ray obtained shows no acute fracture or malalignment.  Overall suspect ankle sprain.  Neurovascular neuromuscular intact.  Will place in air cast splint.  Recommend rice therapy.  Discharged in good condition.  This chart was dictated using voice recognition software.  Despite best efforts to proofread,  errors can occur which can change the documentation meaning.         Final Clinical Impression(s) / ED Diagnoses Final diagnoses:  Sprain of left ankle, unspecified ligament, initial encounter    Rx / DC Orders ED Discharge Orders     None         Virgina Norfolk, DO 08/17/22  1515

## 2022-08-17 NOTE — ED Notes (Signed)
Pt discharged to home. Discharge instructions have been discussed with patient and/or family members. Pt verbally acknowledges understanding d/c instructions, and endorses comprehension to checkout at registration before leaving.  °

## 2022-08-17 NOTE — ED Notes (Signed)
ED Provider at bedside. 

## 2023-08-10 ENCOUNTER — Other Ambulatory Visit (HOSPITAL_BASED_OUTPATIENT_CLINIC_OR_DEPARTMENT_OTHER): Payer: Self-pay

## 2023-08-10 DIAGNOSIS — G4730 Sleep apnea, unspecified: Secondary | ICD-10-CM

## 2023-08-10 DIAGNOSIS — R400 Somnolence: Secondary | ICD-10-CM

## 2023-08-10 DIAGNOSIS — R0683 Snoring: Secondary | ICD-10-CM

## 2023-10-28 ENCOUNTER — Ambulatory Visit (HOSPITAL_BASED_OUTPATIENT_CLINIC_OR_DEPARTMENT_OTHER): Payer: BLUE CROSS/BLUE SHIELD | Admitting: Internal Medicine

## 2025-01-07 ENCOUNTER — Emergency Department (HOSPITAL_BASED_OUTPATIENT_CLINIC_OR_DEPARTMENT_OTHER)
Admission: EM | Admit: 2025-01-07 | Discharge: 2025-01-07 | Disposition: A | Discharge status: Home / Self Care | Attending: Emergency Medicine | Admitting: Emergency Medicine

## 2025-01-07 ENCOUNTER — Emergency Department (HOSPITAL_BASED_OUTPATIENT_CLINIC_OR_DEPARTMENT_OTHER)

## 2025-01-07 ENCOUNTER — Encounter (HOSPITAL_BASED_OUTPATIENT_CLINIC_OR_DEPARTMENT_OTHER): Payer: Self-pay

## 2025-01-07 ENCOUNTER — Other Ambulatory Visit: Payer: Self-pay

## 2025-01-07 DIAGNOSIS — J101 Influenza due to other identified influenza virus with other respiratory manifestations: Secondary | ICD-10-CM | POA: Diagnosis not present

## 2025-01-07 DIAGNOSIS — R059 Cough, unspecified: Secondary | ICD-10-CM | POA: Diagnosis present

## 2025-01-07 DIAGNOSIS — J069 Acute upper respiratory infection, unspecified: Secondary | ICD-10-CM

## 2025-01-07 LAB — RESP PANEL BY RT-PCR (RSV, FLU A&B, COVID)  RVPGX2
Influenza A by PCR: POSITIVE — AB
Influenza B by PCR: NEGATIVE
Resp Syncytial Virus by PCR: NEGATIVE
SARS Coronavirus 2 by RT PCR: NEGATIVE

## 2025-01-07 LAB — CBC WITH DIFFERENTIAL/PLATELET
Abs Immature Granulocytes: 0.05 K/uL (ref 0.00–0.07)
Basophils Absolute: 0.1 K/uL (ref 0.0–0.1)
Basophils Relative: 1 %
Eosinophils Absolute: 0.1 K/uL (ref 0.0–0.5)
Eosinophils Relative: 1 %
HCT: 38.2 % — ABNORMAL LOW (ref 39.0–52.0)
Hemoglobin: 12.3 g/dL — ABNORMAL LOW (ref 13.0–17.0)
Immature Granulocytes: 1 %
Lymphocytes Relative: 9 %
Lymphs Abs: 0.9 K/uL (ref 0.7–4.0)
MCH: 27.7 pg (ref 26.0–34.0)
MCHC: 32.2 g/dL (ref 30.0–36.0)
MCV: 86 fL (ref 80.0–100.0)
Monocytes Absolute: 0.8 K/uL (ref 0.1–1.0)
Monocytes Relative: 8 %
Neutro Abs: 8.1 K/uL — ABNORMAL HIGH (ref 1.7–7.7)
Neutrophils Relative %: 80 %
Platelets: 371 K/uL (ref 150–400)
RBC: 4.44 MIL/uL (ref 4.22–5.81)
RDW: 14.7 % (ref 11.5–15.5)
WBC: 10 K/uL (ref 4.0–10.5)
nRBC: 0 % (ref 0.0–0.2)

## 2025-01-07 LAB — COMPREHENSIVE METABOLIC PANEL WITH GFR
ALT: 28 U/L (ref 0–44)
AST: 33 U/L (ref 15–41)
Albumin: 4.4 g/dL (ref 3.5–5.0)
Alkaline Phosphatase: 70 U/L (ref 38–126)
Anion gap: 11 (ref 5–15)
BUN: 12 mg/dL (ref 6–20)
CO2: 26 mmol/L (ref 22–32)
Calcium: 9.3 mg/dL (ref 8.9–10.3)
Chloride: 102 mmol/L (ref 98–111)
Creatinine, Ser: 1.24 mg/dL (ref 0.61–1.24)
GFR, Estimated: >60 mL/min
Glucose, Bld: 82 mg/dL (ref 70–99)
Potassium: 3.9 mmol/L (ref 3.5–5.1)
Sodium: 138 mmol/L (ref 135–145)
Total Bilirubin: 0.3 mg/dL (ref 0.0–1.2)
Total Protein: 7.2 g/dL (ref 6.5–8.1)

## 2025-01-07 LAB — GROUP A STREP BY PCR: Group A Strep by PCR: NOT DETECTED

## 2025-01-07 MED ORDER — SODIUM CHLORIDE 0.9 % IV BOLUS
1000.0000 mL | Freq: Once | INTRAVENOUS | Status: AC
  Administered 2025-01-07: 1000 mL via INTRAVENOUS

## 2025-01-07 MED ORDER — ACETAMINOPHEN 325 MG PO TABS
650.0000 mg | ORAL_TABLET | Freq: Once | ORAL | Status: AC | PRN
  Administered 2025-01-07: 650 mg via ORAL
  Filled 2025-01-07: qty 2

## 2025-01-07 MED ORDER — DICYCLOMINE HCL 10 MG PO CAPS
10.0000 mg | ORAL_CAPSULE | Freq: Once | ORAL | Status: DC
  Filled 2025-01-07: qty 1

## 2025-01-07 MED ORDER — LACTATED RINGERS IV BOLUS: 500.0000 mL | Freq: Once | INTRAVENOUS | Status: DC

## 2025-01-07 MED ORDER — ALUM & MAG HYDROXIDE-SIMETH 200-200-20 MG/5ML PO SUSP
30.0000 mL | Freq: Once | ORAL | Status: DC
  Filled 2025-01-07: qty 30

## 2025-01-07 MED ORDER — LIDOCAINE VISCOUS HCL 2 % MT SOLN
15.0000 mL | Freq: Once | OROMUCOSAL | Status: DC
  Filled 2025-01-07: qty 15

## 2025-01-07 MED ORDER — OSELTAMIVIR PHOSPHATE 75 MG PO CAPS: 75.0000 mg | ORAL_CAPSULE | Freq: Two times a day (BID) | ORAL | 0 refills | Status: AC

## 2025-01-07 NOTE — ED Triage Notes (Signed)
 Pt reports non productive cough that started yesterday and he states that when he coughs his throat burns. He says he is not coughing anything up. He does report sore throat as well.

## 2025-01-07 NOTE — ED Provider Notes (Signed)
 " Scott Randall   CSN: [id number] Arrival date & time: 01/07/25  2004     Patient presents with: Cough   Scott Randall is a 54 y.o. male.   The patient presents with a burning sensation in the chest and cough.  He developed symptoms last night after visiting people at a hospital. This morning he noticed fatigue, sweating and a burning sensation in the chest with cough located in the upper sternum and extending into midline neck.  He has acid reflux at night but is not currently on medication. He denies chest pain and shortness of breath.  He denies nausea, vomiting, or diarrhea His appetite is decreased today.    Cough Associated symptoms: chills, fever and sore throat   Associated symptoms: no chest pain, no shortness of breath and no wheezing        Prior to Admission medications  Medication Sig Start Date End Date Taking? Authorizing Provider  oseltamivir  (TAMIFLU ) 75 MG capsule Take 1 capsule (75 mg total) by mouth every 12 (twelve) hours. 01/07/25  Yes Scott Perkins, DO  cyclobenzaprine  (FLEXERIL ) 10 MG tablet Take 1 tablet (10 mg total) by mouth 2 (two) times daily as needed for muscle spasms. 12/18/17   Scott Folks, MD  LISINOPRIL PO Take by mouth.    [provider]    Allergies: Patient has no known allergies.    Review of Systems  Constitutional:  Positive for activity change, appetite change, chills, fatigue and fever.  HENT:  Positive for congestion and sore throat.   Respiratory:  Positive for cough. Negative for apnea, chest tightness, shortness of breath and wheezing.   Cardiovascular:  Negative for chest pain, palpitations and leg swelling.  Gastrointestinal:  Negative for diarrhea, nausea and vomiting.    Updated Vital Signs BP (!) 167/94 (BP Location: Left Arm)   Pulse (!) 110   Temp 100.2 F (37.9 C) (Oral)   Resp 15   Ht 5' 4 (1.626 m)   Wt 108.9 kg   SpO2 97%   BMI 41.20  kg/m   Physical Exam Constitutional:      Appearance: He is normal weight. He is ill-appearing.  HENT:     Nose: Congestion present.  Cardiovascular:     Rate and Rhythm: Regular rhythm. Tachycardia present.     Pulses: Normal pulses.     Heart sounds: Normal heart sounds. No murmur heard. Pulmonary:     Effort: Pulmonary effort is normal. No respiratory distress.     Breath sounds: Normal breath sounds. No wheezing.  Chest:     Chest wall: No tenderness.  Abdominal:     General: Abdomen is flat. Bowel sounds are normal.     Palpations: Abdomen is soft.  Skin:    General: Skin is warm.     Capillary Refill: Capillary refill takes 2 to 3 seconds.  Neurological:     General: No focal deficit present.     Mental Status: He is alert and oriented to person, place, and time. Mental status is at baseline.     (all labs ordered are listed, but only abnormal results are displayed) Labs Reviewed  RESP PANEL BY RT-PCR (RSV, FLU A&B, COVID)  RVPGX2 - Abnormal; Notable for the following components:      Result Value   Influenza A by PCR POSITIVE (*)    All other components within normal limits  CBC WITH DIFFERENTIAL/PLATELET - Abnormal; Notable for the following components:  Hemoglobin 12.3 (*)    HCT 38.2 (*)    Neutro Abs 8.1 (*)    All other components within normal limits  GROUP A STREP BY PCR  COMPREHENSIVE METABOLIC PANEL WITH GFR    EKG: EKG Interpretation Date/Time:  Saturday January 07 2025 20:27:55 EST Ventricular Rate:  139 PR Interval:  145 QRS Duration:  72 QT Interval:  279 QTC Calculation: 425 R Axis:   47  Text Interpretation: Sinus tachycardia Borderline T wave abnormalities No previous ECGs available Confirmed by Scott Alm DEL ([id number]) on 01/07/2025 8:57:53 PM  Radiology: [facility] Chest 2 View Result Date: 01/07/2025 CLINICAL DATA:  Productive cough, sore throat EXAM: DG CHEST 2V COMPARISON:  None Available. FINDINGS: The heart size and mediastinal contours are  within normal limits. Both lungs are clear. The visualized skeletal structures are unremarkable. IMPRESSION: No active cardiopulmonary disease. Electronically Signed   By: Scott Randall M.D.   On: 01/07/2025 21:09     Procedures   Medications Ordered in the ED  dicyclomine  (BENTYL ) capsule 10 mg (10 mg Oral Patient Refused/Not Given 01/07/25 2110)  alum & mag hydroxide-simeth (MAALOX/MYLANTA) 200-200-20 MG/5ML suspension 30 mL (30 mLs Oral Patient Refused/Not Given 01/07/25 2110)    And  lidocaine  (XYLOCAINE ) 2 % viscous mouth solution 15 mL (15 mLs Oral Patient Refused/Not Given 01/07/25 2110)  acetaminophen  (TYLENOL ) tablet 650 mg (650 mg Oral Given 01/07/25 2034)  sodium chloride  0.9 % bolus 1,000 mL (1,000 mLs Intravenous New Bag/Given 01/07/25 2114)                                    Medical Decision Making [name] is a 54 y.o. male presenting for burning sensation in chest with cough. He reports having multiple potential sick contacts within the last week. Symptoms began with cough, body aches and fatigue last night. This morning he work up feeling worse and became worried when he had a burning sensation in his chest. On presentation he was found to be febrile to 100.8 and tachycardic. On exam he is ill-appearing with no signs of respiratory distress. Known history of GERD not currently on medication. Patient denies CP and SOB. Will trial GI cocktail for burning sensation and bolus 1 L of fluids for the tachycardia.    Quad screen positive for Flu A. CBC and BMP WNL. On reassessment patient refused GI cocktail. He feels well to return home. Will prescribe Tamiflu  75 mg BID for 5 days for treatment. Patient to follow up with PCP as needed.   Amount and/or Complexity of Data Reviewed Labs: ordered. Radiology: ordered.  Risk OTC drugs. Prescription drug management.       Final diagnoses:  Acute upper respiratory infection  Influenza A    ED Discharge Orders           Ordered    oseltamivir  (TAMIFLU ) 75 MG capsule  Every 12 hours        01/07/25 2226               Scott Perkins, DO 01/07/25 2230    Scott Alm Macho, MD 01/07/25 2248  "
